# Patient Record
Sex: Male | Born: 1985 | Race: White | Hispanic: No | Smoking: Never smoker
Health system: Southern US, Community
[De-identification: ages and names within clinical notes are randomized; demographics above are authoritative.]

## PROBLEM LIST (undated history)

## (undated) HISTORY — PX: NO PAST SURGERIES: SHX2092

---

## 2015-03-12 ENCOUNTER — Other Ambulatory Visit: Payer: Self-pay | Admitting: Family Medicine

## 2015-03-12 DIAGNOSIS — N5082 Scrotal pain: Secondary | ICD-10-CM

## 2015-03-17 ENCOUNTER — Ambulatory Visit
Admission: RE | Admit: 2015-03-17 | Discharge: 2015-03-17 | Disposition: A | Discharge status: Home / Self Care | Payer: BLUE CROSS/BLUE SHIELD | Source: Ambulatory Visit | Attending: Family Medicine | Admitting: Family Medicine

## 2015-03-17 DIAGNOSIS — N5082 Scrotal pain: Secondary | ICD-10-CM | POA: Diagnosis present

## 2015-03-17 DIAGNOSIS — I861 Scrotal varices: Secondary | ICD-10-CM | POA: Diagnosis not present

## 2015-03-18 ENCOUNTER — Other Ambulatory Visit: Payer: Self-pay | Admitting: Family Medicine

## 2015-03-18 DIAGNOSIS — N5082 Scrotal pain: Secondary | ICD-10-CM

## 2015-03-19 ENCOUNTER — Encounter: Payer: Self-pay | Admitting: Family Medicine

## 2015-03-25 ENCOUNTER — Ambulatory Visit: Payer: BLUE CROSS/BLUE SHIELD | Attending: Family Medicine

## 2015-03-27 ENCOUNTER — Encounter: Payer: Self-pay | Admitting: Urology

## 2015-03-27 ENCOUNTER — Ambulatory Visit (INDEPENDENT_AMBULATORY_CARE_PROVIDER_SITE_OTHER): Payer: BLUE CROSS/BLUE SHIELD | Admitting: Urology

## 2015-03-27 VITALS — BP 115/74 | HR 60 | Ht 72.0 in | Wt 146.2 lb

## 2015-03-27 DIAGNOSIS — I861 Scrotal varices: Secondary | ICD-10-CM | POA: Diagnosis not present

## 2015-03-27 DIAGNOSIS — R102 Pelvic and perineal pain: Secondary | ICD-10-CM

## 2015-03-27 NOTE — Progress Notes (Signed)
03/27/2015 10:25 AM   Jordan Aguirre Dec 28, 1985 [id number]  Referring provider: Sula Rumple, MD [address] MEDICAL [address] [place], [place] [address]  Chief Complaint  Patient presents with  . New Patient (Initial Visit)    bilateral varicoceles    HPI: The patient presents with a 5 year history of left groin pain. He describes a sharp pain that is intermittent. At its worst, it is a 5 out of 10. It is intermittent. It is made worse by standing or long bouts of physical therapy. He also noted that it was worse when he [place].  He states sitting in certain positions make it better. He had a scrotal ultrasound that showed bilateral hydroceles with no other findings.  He denies any other urinary or urological problems at this time.   PMH: No past medical history on file.  Surgical History: Past Surgical History  Procedure Laterality Date  . No past surgeries      Home Medications:    Medication List    Notice  As of 03/27/2015 10:25 AM   You have not been prescribed any medications.      Allergies: No Known Allergies  Family History: Family History  Problem Relation Age of Onset  . Prostate cancer Neg Hx   . Bladder Cancer Neg Hx     Social History:  reports that he has never smoked. He does not have any smokeless tobacco history on file. He reports that he drinks alcohol. He reports that he does not use illicit drugs.  ROS: UROLOGY Frequent Urination?: No Hard to postpone urination?: No Burning/pain with urination?: No Get up at night to urinate?: No Leakage of urine?: No Urine stream starts and stops?: No Trouble starting stream?: No Do you have to strain to urinate?: No Blood in urine?: No Urinary tract infection?: No Sexually transmitted disease?: No Injury to kidneys or bladder?: No Painful intercourse?: No Weak stream?: No Erection problems?: No Penile pain?: No  Gastrointestinal Nausea?: No Vomiting?:  No Indigestion/heartburn?: No Diarrhea?: No Constipation?: No  Constitutional Fever: No Night sweats?: No Weight loss?: No Fatigue?: No  Skin Skin rash/lesions?: No Itching?: No  Eyes Blurred vision?: No Double vision?: No  Ears/Nose/Throat Sore throat?: No Sinus problems?: No  Hematologic/Lymphatic Swollen glands?: No Easy bruising?: No  Cardiovascular Leg swelling?: No Chest pain?: No  Respiratory Cough?: No Shortness of breath?: No  Endocrine Excessive thirst?: No  Musculoskeletal Back pain?: No Joint pain?: No  Neurological Headaches?: No Dizziness?: No  Psychologic Depression?: No Anxiety?: No  Physical Exam: BP 115/74 mmHg  Pulse 60  Ht 6' (1.829 m)  Wt 146 lb 3.2 oz (66.316 kg)  BMI 19.82 kg/m2  Constitutional:  Alert and oriented, No acute distress. HEENT: [place] AT, moist mucus membranes.  Trachea midline, no masses. Cardiovascular: No clubbing, cyanosis, or edema. Respiratory: Normal respiratory effort, no increased work of breathing. GI: Abdomen is soft, nontender, nondistended, no abdominal masses GU: No CVA tenderness. Normal phallus. Testicles are bilaterally. Unable to palpate the right varicocele seen on ultrasound. He does have a left varicocele. His pain is superior lateral to his spermatic cord. He does not have a hernia. Skin: No rashes, bruises or suspicious lesions. Lymph: No cervical or inguinal adenopathy. Neurologic: Grossly intact, no focal deficits, moving all 4 extremities. Psychiatric: Normal mood and affect.  Laboratory Data: No results found for: WBC, HGB, HCT, MCV, PLT  No results found for: CREATININE  No results found for:  PSA  No results found for: TESTOSTERONE  No results found for: HGBA1C  Urinalysis No results found for: COLORURINE, APPEARANCEUR, LABSPEC, PHURINE, GLUCOSEU, HGBUR, BILIRUBINUR, KETONESUR, PROTEINUR, UROBILINOGEN, NITRITE, LEUKOCYTESUR  Pertinent Imaging: CLINICAL DATA: Scrotal pain  LEFT greater than RIGHT intermittently for 5 years  EXAM: SCROTAL ULTRASOUND  DOPPLER ULTRASOUND OF THE TESTICLES  TECHNIQUE: Complete ultrasound examination of the testicles, epididymis, and other scrotal structures was performed. Color and spectral Doppler ultrasound were also utilized to evaluate blood flow to the testicles.  COMPARISON: None  FINDINGS: Right testicle  Measurements: 5.0 x 2.4 x 3.6 cm. Normal echogenicity without mass or calcification. Internal blood flow present on color Doppler imaging.  Left testicle  Measurements: 4.5 x 2.6 x 3.6 cm. Normal morphology without mass or calcification. Internal blood flow present on color Doppler imaging, symmetric with RIGHT.  Right epididymis: Normal in size and appearance.  Left epididymis: Normal in size and appearance.  Hydrocele: Minimal hydrocele fluid bilaterally.  Varicocele: BILATERAL varicoceles  Pulsed Doppler interrogation of both testes demonstrates normal low resistance arterial and venous waveforms bilaterally.  IMPRESSION: Normal appearing testes and epididymal head bilaterally.  No evidence of testicular torsion or mass.  BILATERAL varicoceles.  Assessment & Plan:    The patient has left groin pain. I feel his left varicocele is an incidental finding is his pain is not localized near the site. Due to his exacerbation of his symptoms with activity, I feel he has pelvic floor instability/pain. I discussed with the patient that the best treatment option for this is physical therapy. He is agreeable to proceeding with the pelvic floor physical therapy.  1. Left groin pain/pelvic floor pain -Referral to pelvic floor physical therapy  2. Left varicocele  -Asymptomatic no treatment required.   Return in about 3 months (around 06/25/2015).  Hildred Laser, MD  [facility] [address] [place], [place] [address] [phone]

## 2015-04-10 ENCOUNTER — Encounter: Payer: Self-pay | Admitting: Physical Therapy

## 2015-04-10 ENCOUNTER — Ambulatory Visit: Payer: BLUE CROSS/BLUE SHIELD | Attending: Urology | Admitting: Physical Therapy

## 2015-04-10 DIAGNOSIS — M25561 Pain in right knee: Secondary | ICD-10-CM | POA: Diagnosis present

## 2015-04-10 DIAGNOSIS — M25562 Pain in left knee: Secondary | ICD-10-CM | POA: Diagnosis present

## 2015-04-10 DIAGNOSIS — G8929 Other chronic pain: Secondary | ICD-10-CM | POA: Diagnosis present

## 2015-04-10 DIAGNOSIS — R279 Unspecified lack of coordination: Secondary | ICD-10-CM | POA: Insufficient documentation

## 2015-04-10 DIAGNOSIS — R1032 Left lower quadrant pain: Secondary | ICD-10-CM | POA: Diagnosis present

## 2015-04-10 DIAGNOSIS — R531 Weakness: Secondary | ICD-10-CM | POA: Diagnosis present

## 2015-04-10 NOTE — Therapy (Signed)
[place] [facility] MAIN [facility] SERVICES [address] [place], [place], [address] Phone: [phone]   Fax:  [phone]  Physical Therapy Evaluation  Patient Details  Name: Jordan Aguirre MRN: [id number] Date of Birth: 1985-08-18 Referring Provider: Sherryl Barters  Encounter Date: 04/10/2015      PT End of Session - 04/10/15 1025    Visit Number 1   Number of Visits 12   Date for PT Re-Evaluation 07/03/15   PT Start Time 0905   PT Stop Time 1010   PT Time Calculation (min) 65 min   Activity Tolerance Patient tolerated treatment well;No increased pain   Behavior During Therapy [facility] for tasks assessed/performed      History reviewed. No pertinent past medical history.  Past Surgical History  Procedure Laterality Date  . No past surgeries      There were no vitals filed for this visit.  Visit Diagnosis:  Lack of coordination   Weakness -  Knee pain, bilateral  Chronic groin pain, left      Subjective Assessment - 04/10/15 0904    Subjective 1) L groin pain: Pt reports he felt L localized groin pain (dull ache to sharp) that occurred once a month for the past 5 years after walking 10 miles or heavy lifting. The pain has become progressive worse with increase in frequency since this past spring to daily occurrence of pain. Pain remains dull 2/10 typically but as he carries objects (backpack, plywood) , the pain will increase to 5/10 after 30 min and lasts for 30 min-1 hr.  Pain eases with laying down. Pt has not received any medical attention to this issue. denied urinary/ bowel/ and sexual dysfunction. Hx of playing soccer and tennis in high school with repeated sit up and crunches. Pt has withheld weight lifting due pain but would like to get back it. Pt would like to be able to walk 10 miles without pain. Pt walks 1 mile daily without pain but if he walked 2-3 miles, the pain will increase.  2) B knee pain: with stair climbing. L knee injury after playing  basketball 10 years which had led to not playing for 4-6 weeks.      Pertinent History Hx of  ankle sprain (required crutches) . Pt was not sure of which LE. Did not seek PT.    Patient Stated Goals exercises and reduce the pain to increase the physical activity.    Currently in Pain? 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 w/ rising from flexion. B rotation 50%    Strength   Overall Strength Comments hip flex seated 4-/5    Palpation   SI assessment  symmetrical SIJ   Palpation comment not able to reproduce pain w/ palpation over L suprapubic, anterior/ posterior perineal triangles in hooklying   Bed Mobility   Bed Mobility --  log rolling   Ambulation/Gait   Gait Comments slouched, decreased stride length, decreased hip flexion b                 Pelvic Floor Special Questions - 04/10/15 1045    Diastasis Recti 2.5 fingers width below sternum          [facility] Adult PT Treatment/Exercise - 04/10/15 1018    Self-Care   Self-Care --  POC, anatomy/  physiology, HEP , goals   Therapeutic Activites    Therapeutic Activities --  body mechanics w. (lifting, digging, shoveling)    Neuro Re-ed    Neuro Re-ed Details  decrease downward forces on pelvic floor and abdomen (see pt instructions) , alignment and posture                 PT Education - 04/10/15 1025    Education provided Yes   Education Details POC, anatomy, physiology, goals, principles of deep core in functional activities, body mechanics training   Person(s) Educated Patient   Methods Explanation;Demonstration;Tactile cues;Verbal cues;Handout   Comprehension Verbalized understanding             PT Long Term Goals - 04/10/15 4098    PT LONG TERM GOAL #1   Title Pt will be able to demo proper lifting mechanics with deep core activation in order to minimize worsening of symptoms.    Time 12   Period Weeks   Status New   PT LONG TERM GOAL #2   Title Pt will demo proper sitting posture across 2 visits in order to show improved alignment of spine for optimial deep core mm activation.   Time 12   Period Weeks   Status New   PT LONG TERM GOAL #3   Title Pt will decrease his score on NIH-CPSI: 30% to < 15% in order to show improve QOL and return to activities.    Time 12   Period Weeks   Status New   PT LONG TERM GOAL #4   Title Pt will decrease his score on PDI from 20% to < 10% in order to improve QOL.    Time 12   Period Weeks   Status New   PT LONG TERM GOAL #5   Title Pt will demo no lumbopelvic insstability across 5 reps of deep core level 1-4 exercises to progress to upright strengthening exercises and improve sever thoracic kyphosis for optimal deep core activition.   Time 12   Period Weeks   Status New   Additional Long Term Goals   Additional Long Term Goals Yes   PT LONG TERM GOAL #6   Title Pt will report no knee pain with stairclimbing in order to return ADLs.   Time 12   Period Weeks   Status New               Plan - 04/10/15  1028    Clinical Impression Statement Pt is a 30 yo male who complaints of  chronic L groin pain and B knee pain (L >  R) which impacts his ability to  perform heavy lifting and walking long distances (10 miles). Pt's  clinical presentations show generalized weakness, abdominal separation  of rectus abdominis (diastasis recti 2.5 fingers width below sternum),  poor  activation of deep core mm system, poor body mechanics with exertional  activities, and poor upright posture and knee alignment in sitting,  standing, walking, and functional activities.  Pain was not reproduced w/ palpation over pelvic floor mm, R inguinal area, nor thoracic spine (nerve roots to inguinal/ groin area). Pain was reduced with training on body mechanics and co-activation of deep core mm  during against gravity positions which indicates pt will require more motor control and strengthening to address his Sx. Pt 's personal  factors that contribute ot his  deficits include Hx of knee injury, excessive performance of  sit-ups/crunches in the past, chronicity of pain and decreased physical  activities, and repeated lifting and exertional activities at his current  job. With these factors combined, pt's condition is moderate in complexity  and evolving.             Pt will benefit from skilled therapeutic intervention in order to improve on the following deficits Abnormal gait;Improper body mechanics;Postural dysfunction;Decreased activity tolerance;Decreased mobility;Impaired flexibility;Decreased range of motion;Decreased cognition;Hypermobility;Decreased coordination;Decreased endurance;Increased fascial restricitons;Hypomobility;Pain;Decreased safety awareness   Rehab Potential Good   PT Frequency 1x / week   PT Duration 12 weeks   PT Treatment/Interventions ADLs/Self Care Home Management;Aquatic Therapy;Biofeedback;Cryotherapy;Electrical Stimulation;Iontophoresis 4mg /ml Dexamethasone;Traction;Moist Heat;Patient/family  education;Balance training;Therapeutic activities;Therapeutic exercise;Functional mobility training;Stair training;Gait training;Manual techniques;Passive range of motion;Energy conservation;Taping   Consulted and Agree with Plan of Care Patient         Problem List There are no active problems to display for this patient.   Mariane Masters ,PT, DPT, E-RYT  04/10/2015, 10:55 AM  [place] [facility] MAIN [facility] SERVICES [address] [place], [place], [address] Phone: [phone]   Fax:  [phone]  Name: Jordan Aguirre MRN: [id number] Date of Birth: 1986/01/25

## 2015-04-10 NOTE — Patient Instructions (Signed)
                                               Preserve the function of your pelvic floor, abdomen, and back.              Avoid decreased straining of abdominal/pelvic floor muscles with less              slouching,  holding your breath with lifting/bowel movements)                                                     FUNCTIONAL POSTURES        

## 2015-05-05 ENCOUNTER — Ambulatory Visit: Payer: BLUE CROSS/BLUE SHIELD | Attending: Urology | Admitting: Physical Therapy

## 2015-05-05 DIAGNOSIS — M25562 Pain in left knee: Secondary | ICD-10-CM | POA: Insufficient documentation

## 2015-05-05 DIAGNOSIS — R279 Unspecified lack of coordination: Secondary | ICD-10-CM | POA: Insufficient documentation

## 2015-05-05 DIAGNOSIS — R1032 Left lower quadrant pain: Secondary | ICD-10-CM | POA: Insufficient documentation

## 2015-05-05 DIAGNOSIS — G8929 Other chronic pain: Secondary | ICD-10-CM | POA: Insufficient documentation

## 2015-05-05 DIAGNOSIS — M25561 Pain in right knee: Secondary | ICD-10-CM | POA: Insufficient documentation

## 2015-05-05 DIAGNOSIS — R531 Weakness: Secondary | ICD-10-CM | POA: Insufficient documentation

## 2015-05-12 ENCOUNTER — Ambulatory Visit: Payer: BLUE CROSS/BLUE SHIELD | Admitting: Physical Therapy

## 2015-05-12 DIAGNOSIS — G8929 Other chronic pain: Secondary | ICD-10-CM | POA: Diagnosis present

## 2015-05-12 DIAGNOSIS — R531 Weakness: Secondary | ICD-10-CM

## 2015-05-12 DIAGNOSIS — R279 Unspecified lack of coordination: Secondary | ICD-10-CM | POA: Diagnosis present

## 2015-05-12 DIAGNOSIS — M25562 Pain in left knee: Secondary | ICD-10-CM | POA: Diagnosis present

## 2015-05-12 DIAGNOSIS — M25561 Pain in right knee: Secondary | ICD-10-CM | POA: Diagnosis present

## 2015-05-12 DIAGNOSIS — R1032 Left lower quadrant pain: Secondary | ICD-10-CM

## 2015-05-12 NOTE — Patient Instructions (Addendum)
You are now ready to begin training the deep core muscles system: diaphragm, transverse abdominis, pelvic floor . These muscles must work together as a team.           The key to these exercises to train the brain to coordinate the timing of these muscles and to have them turn on for long periods of time to hold you upright against gravity (especially important if you are on your feet all day).These muscles are postural muscles and play a role stabilizing your spine and bodyweight. By doing these repetitions slowly and correctly instead of doing crunches, you will achieve a flatter belly without a lower pooch. You are also placing your spine in a more neutral position and breathing properly which in turn, decreases your risk for problems related to your pelvic floor, abdominal, and low back such as pelvic organ prolapse, hernias, diastasis recti (separation of superficial muscles), disk herniations, spinal fractures. These exercises set a solid foundation for you to later progress to resistance/ strength training with therabands and weights and return to other typical fitness exercises with a stronger deeper core.      deep core 1-2 x 20reps  (see above)  10 x 3 sets          "w" w/ red band x  10 x 3 sets         bridging level 1 -2 w/ yellow band 10 x 3 sets         Floor to rise:  Half kneeling  To high lunge  :  Feet hip width, front knee above ankle (do not let it move it)   5 x

## 2015-05-12 NOTE — Therapy (Addendum)
[place] MAIN [facility] SERVICES [address] [place], [place], [address] Phone: [phone]   Fax:  [phone]  Physical Therapy Treatment  Patient Details  Name: Jordan Aguirre MRN: [id number] Date of Birth: [birth year] Referring Provider: Pilar Aguirre  Encounter Date: 05/12/2015      PT End of Session - 05/12/15 0836    Visit Number 2   Number of Visits 12   Date for PT Re-Evaluation 07/03/15   PT Start Time 0805   PT Stop Time 0850   PT Time Calculation (min) 45 min   Activity Tolerance Patient tolerated treatment well;No increased pain   Behavior During Therapy [facility] Aguirre for tasks assessed/performed      No past medical history on file.  Past Surgical History  Procedure Laterality Date  . No past surgeries      There were no vitals filed for this visit.  Visit Diagnosis:  Lack of coordination  Knee pain, bilateral  Chronic groin pain, left  Weakness      Subjective Assessment - 05/12/15 0810    Subjective Pt reported 20% improvement with L groin pain with proper sitting and exhaling technqiue withe xertional activities. Pt will start a new job and will be moving out of state in two weeks. Pt will only be able to attend PT for 2 more visits.    Pertinent History Hx of  ankle sprain (required crutches) . Pt was not sure of which LE. Did not seek PT.    Patient Stated Goals exercises and reduce the pain to increase the physical activity.             [facility] PT Assessment - 05/12/15 0811    Observation/Other Assessments   Observations required cuing for sitting, feet under knees                      [facility] Adult PT Treatment/Exercise - 05/12/15 0817    Neuro Re-ed    Neuro Re-ed Details  deep core 1-2 x 20reps, "w" x 10 reps.bridging level 1 -2 w/ yellow band (10 rep), floor to rise 10 reps  each leg                   PT Education - 05/12/15 0835    Education provided Yes   Education Details HEP   Person(s)  Educated Patient   Methods Explanation;Demonstration;Tactile cues;Verbal cues;Handout   Comprehension Returned demonstration;Verbalized understanding             PT Long Term Goals - 05/12/15 0837    PT LONG TERM GOAL #1   Title Pt will be able to demo proper lifting mechanics with deep core activation in order to minimize worsening of symptoms.    Time 12   Period Weeks   Status Achieved   PT LONG TERM GOAL #2   Title Pt will demo proper sitting posture across 2 visits in order to show improved alignment of spine for optimial deep core mm activation.   Time 12   Period Weeks   Status Partially Met   PT LONG TERM GOAL #3   Title Pt will decrease his score on NIH-CPSI: 30% to < 15% in order to show improve QOL and return to activities.    Time 12   Period Weeks   Status On-going   PT LONG TERM GOAL #4   Title Pt will decrease his score on [place] from 20% to < 10% in order to improve  QOL.    Time 12   Period Weeks   Status New   PT LONG TERM GOAL #5   Title Pt will demo no lumbopelvic instability across 5 reps of deep core level 1-4 exercises to progress to upright strengthening exercises and improve sever thoracic kyphosis for optimal deep core activition.   Time 12   Period Weeks   Status Partially Met   PT LONG TERM GOAL #6   Title Pt will report no knee pain with stairclimbing in order to return ADLs.   Time 12   Period Weeks   Status On-going               Plan - 05/12/15 0836    Clinical Impression Statement Pt is progressing well and was able to tolerate deep core and external core strengthening exercises with moderate cuing for scap depression/retraction and eccentric control.  Pt will continue to benefit from skilled PT.    Pt will benefit from skilled therapeutic intervention in order to improve on the following deficits Abnormal gait;Improper body mechanics;Postural dysfunction;Decreased activity tolerance;Decreased mobility;Impaired flexibility;Decreased  range of motion;Decreased cognition;Hypermobility;Decreased coordination;Decreased endurance;Increased fascial restricitons;Hypomobility;Pain;Decreased safety awareness   Rehab Potential Good   PT Frequency 1x / week   PT Duration 12 weeks   PT Treatment/Interventions ADLs/Self Care Home Management;Aquatic Therapy;Biofeedback;Cryotherapy;Electrical Stimulation;Iontophoresis 67m/ml Dexamethasone;Traction;Moist Heat;Patient/family education;Balance training;Therapeutic activities;Therapeutic exercise;Functional mobility training;Stair training;Gait training;Manual techniques;Passive range of motion;Energy conservation;Taping   Consulted and Agree with Plan of Care Patient        Problem List There are no active problems to display for this patient.   Jordan Aguirre,PT, DPT, E-RYT  05/12/2015, 11:00 AM  [place] [facility] [address]RWinters [place] [address]Phone: [phone]  Fax:  [phone] Name: Jordan SanMRN: [id number] of Birth: 410-01-1986

## 2015-05-19 ENCOUNTER — Ambulatory Visit: Payer: BLUE CROSS/BLUE SHIELD | Admitting: Physical Therapy

## 2015-05-26 ENCOUNTER — Encounter: Payer: BLUE CROSS/BLUE SHIELD | Admitting: Physical Therapy

## 2015-06-02 ENCOUNTER — Encounter: Payer: BLUE CROSS/BLUE SHIELD | Admitting: Physical Therapy

## 2015-06-04 ENCOUNTER — Ambulatory Visit: Payer: BLUE CROSS/BLUE SHIELD | Attending: Urology | Admitting: Physical Therapy

## 2015-06-04 DIAGNOSIS — M25561 Pain in right knee: Secondary | ICD-10-CM | POA: Insufficient documentation

## 2015-06-04 DIAGNOSIS — R1032 Left lower quadrant pain: Secondary | ICD-10-CM | POA: Insufficient documentation

## 2015-06-04 DIAGNOSIS — G8929 Other chronic pain: Secondary | ICD-10-CM | POA: Diagnosis present

## 2015-06-04 DIAGNOSIS — M25562 Pain in left knee: Secondary | ICD-10-CM | POA: Insufficient documentation

## 2015-06-04 DIAGNOSIS — R531 Weakness: Secondary | ICD-10-CM | POA: Insufficient documentation

## 2015-06-04 DIAGNOSIS — R279 Unspecified lack of coordination: Secondary | ICD-10-CM

## 2015-06-04 NOTE — Patient Instructions (Addendum)
Level 3, 4 (handout of deep core exercises)  Level 5 opposite arm and leg    ___________________________ 20 ft x both sides x 2 laps:   1) Sidestepping  + squat (knees behind toes) With red band at ankles    2) Sidestepping  _+ squat + tricep curls behind head    3) Forward lunge with bicep curls , reset with feet parallel between steps   __________________________  1) Blue band at top of door (PNF patterns)  10 x 3 laps  __Seatbelt   2) Blue band at doorknob 10 x 2 with right foot forward, 10 x 2 with left foot forward   3) Blue band under feet:  ____Drawing the sword   ____standning on one foot, bicep curls  __________________________  Wall squats with shoulder back  10 x 2 (knee angle 60 degree )   Then hold 10 pound weight  10 x 2   __________________________  Stretches:   Figure 4 Knees to chest 3 way stretch at table top

## 2015-06-04 NOTE — Therapy (Signed)
[place] MAIN [facility] SERVICES [address] [place], [place], [address] Phone: [phone]   Fax:  [phone]  Physical Therapy Treatment /Discharge Summary  Patient Details  Name: Jordan Aguirre MRN: [id number] Date of Birth: 10-30-1985 Referring Provider: Pilar Jarvis  Encounter Date: 06/04/2015      PT End of Session - 06/04/15 1140    Visit Number 3   Number of Visits 12   Date for PT Re-Evaluation 07/03/15   PT Start Time 0945   PT Stop Time 5320   PT Time Calculation (min) 60 min   Activity Tolerance Patient tolerated treatment well;No increased pain   Behavior During Therapy [facility] for tasks assessed/performed      No past medical history on file.  Past Surgical History  Procedure Laterality Date  . No past surgeries      There were no vitals filed for this visit.  Visit Diagnosis:  Lack of coordination  Knee pain, bilateral  Chronic groin pain, left  Weakness      Subjective Assessment - 06/04/15 1104    Subjective Pt reported he has not had L groin pain for the past 3 weeks except when he lifted 50 pounds items. The pain dissipated after a day. Pt will not be able to come to any more PT sessions due to [place]. Pt has been performing his exercises.    Pertinent History Hx of  ankle sprain (required crutches) . Pt was not sure of which LE. Did not seek PT.    Patient Stated Goals exercises and reduce the pain to increase the physical activity.             [facility] PT Assessment - 06/04/15 1108    Observation/Other Assessments   Observations slouched posture, required excessive cues   Coordination   Gross Motor Movements are Fluid and Coordinated --  good coordination with deep core exercises                     [facility] Adult PT Treatment/Exercise - 06/04/15 1135    Therapeutic Activites    Therapeutic Activities --  simulated hiking w/ weighted bookbag, lifting w/ 20# box    Neuro Re-ed    Neuro Re-ed Details  upright posture   scapular retraction   Exercises   Exercises --  see pt instructions                PT Education - 06/04/15 1140    Education provided Yes   Education Details HEP, d/c   Person(s) Educated Patient   Methods Explanation;Demonstration;Tactile cues;Verbal cues;Handout   Comprehension Returned demonstration;Verbalized understanding             PT Long Term Goals - 06/04/15 0953    PT LONG TERM GOAL #1   Title Pt will be able to demo proper lifting mechanics with deep core activation in order to minimize worsening of symptoms.    Time 12   Period Weeks   Status Achieved   PT LONG TERM GOAL #2   Title Pt will demo proper sitting posture across 2 visits in order to show improved alignment of spine for optimial deep core mm activation.   Time 12   Period Weeks   Status Achieved   PT LONG TERM GOAL #3   Title Pt will decrease his score on NIH-CPSI: 30% to < 15% in order to show improve QOL and return to activities. (4/5: 9%)  Time 12   Period Weeks   Status Not Met   PT LONG TERM GOAL #4   Title Pt will decrease his score on Register from 20% to < 10% in order to improve QOL. (9% 4/5/)    Time 12   Period Weeks   Status Achieved   PT LONG TERM GOAL #5   Title Pt will demo no lumbopelvic instability across 5 reps of deep core level 1-4 exercises to progress to upright strengthening exercises and improve sever thoracic kyphosis for optimal deep core activition.   Time 12   Period Weeks   Status Achieved   Additional Long Term Goals   Additional Long Term Goals Yes   PT LONG TERM GOAL #6   Title Pt will report no knee pain with stairclimbing in order to return ADLs.   Time 12   Period Weeks   Status Achieved   PT LONG TERM GOAL #7   Title Pt wil be able to have no pain with bouncy car rides.    Time 12   Period Weeks   Status Achieved               Plan - 06/04/15 1028    Clinical Impression Statement Across the  past 3 visits, pt reports his groin pain has improved "Quite a bit better " since [facility] according to the [facility] scale. Pt reports with heavyl lifting, his groin pain has improved  "A little bit better". Pt has achieved 6/7 goals and was progressing well towards his remaining goal but pt has to end therapy due to moving out of town for a job. Pt is ready to be d/c at this time as he has been educated on progression of strengthening exercises and body mechanics.  Thank you for your referral!    Pt will benefit from skilled therapeutic intervention in order to improve on the following deficits Abnormal gait;Improper body mechanics;Postural dysfunction;Decreased activity tolerance;Decreased mobility;Impaired flexibility;Decreased range of motion;Decreased cognition;Hypermobility;Decreased coordination;Decreased endurance;Increased fascial restricitons;Hypomobility;Pain;Decreased safety awareness   Rehab Potential Good   PT Frequency 1x / week   PT Duration 12 weeks   PT Treatment/Interventions ADLs/Self Care Home Management;Aquatic Therapy;Biofeedback;Cryotherapy;Electrical Stimulation;Iontophoresis 42m/ml Dexamethasone;Traction;Moist Heat;Patient/family education;Balance training;Therapeutic activities;Therapeutic exercise;Functional mobility training;Stair training;Gait training;Manual techniques;Passive range of motion;Energy conservation;Taping   Consulted and Agree with Plan of Care Patient        Problem List There are no active problems to display for this patient.   YJerl Mina,PT, DPT, E-RYT  06/04/2015, 3:48 PM  [place]SERV[facility] [address] [place] [address]Phone: [phone]  Fax:  [phone] Name: Jordan ManueleMRN: [id number] of Birth: [birth year]

## 2015-06-09 ENCOUNTER — Encounter: Payer: BLUE CROSS/BLUE SHIELD | Admitting: Physical Therapy

## 2015-06-16 ENCOUNTER — Encounter: Payer: BLUE CROSS/BLUE SHIELD | Admitting: Physical Therapy

## 2015-06-23 ENCOUNTER — Encounter: Payer: BLUE CROSS/BLUE SHIELD | Admitting: Physical Therapy

## 2015-06-25 ENCOUNTER — Ambulatory Visit: Payer: BLUE CROSS/BLUE SHIELD

## 2017-11-10 IMAGING — US US SCROTUM
1 series · 14 of 25 positions shown · non-contrast
Comparison: None

CLINICAL DATA: Scrotal pain LEFT greater than RIGHT intermittently
for 5 years

EXAM:
SCROTAL ULTRASOUND
DOPPLER ULTRASOUND OF THE TESTICLES
TECHNIQUE: Complete ultrasound examination of the testicles, epididymis, and
other scrotal structures was performed. Color and spectral Doppler
ultrasound were also utilized to evaluate blood flow to the
testicles.

[Series 1: us scrotum · 0.07mm/px · 14 of 71 slices shown]
[im 1/71]
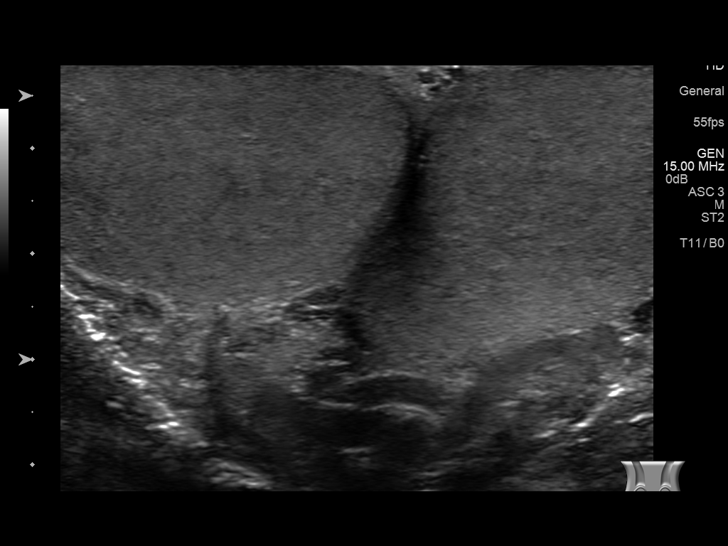
[im 6/71]
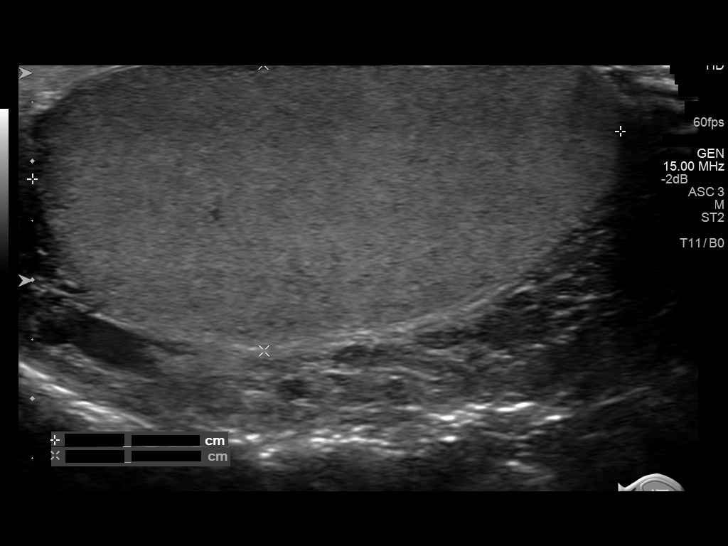
[im 12/71]
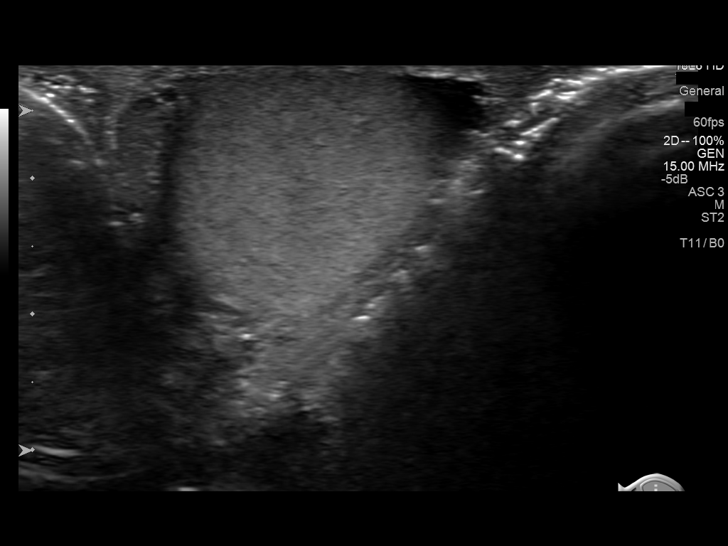
[im 18/71]
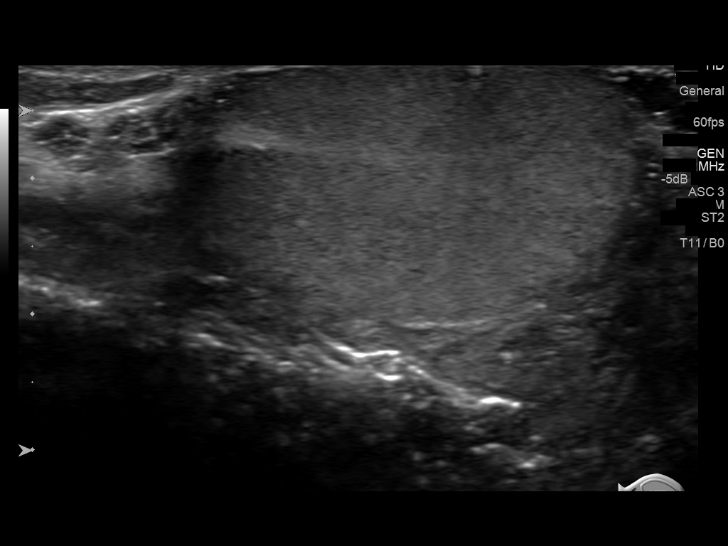
[im 24/71]
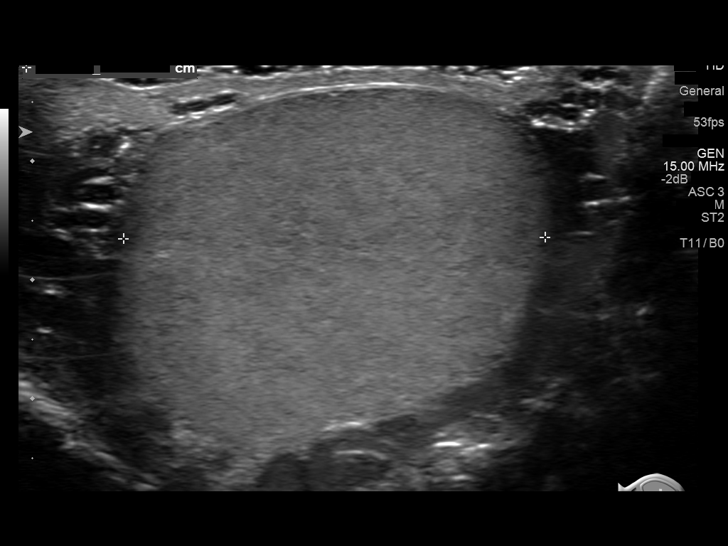
[im 27/71]
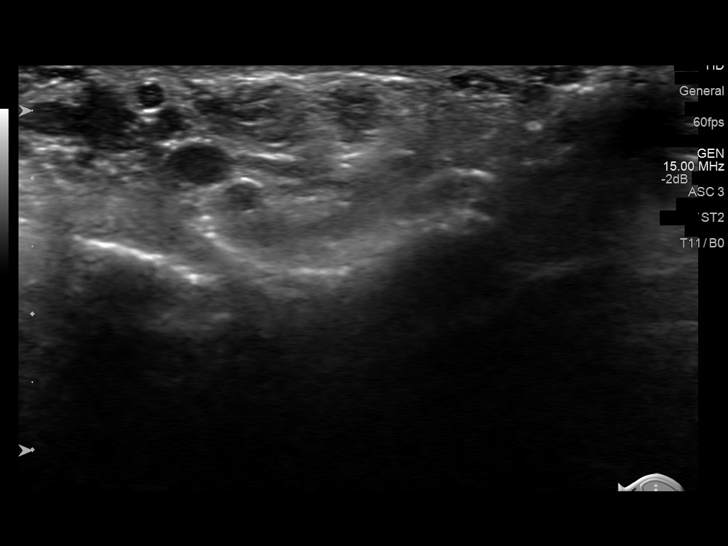
[im 33/71]
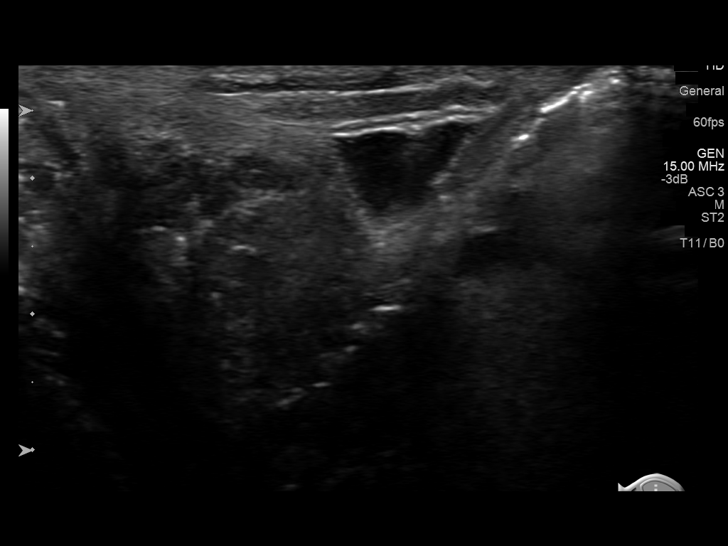
[im 38/71]
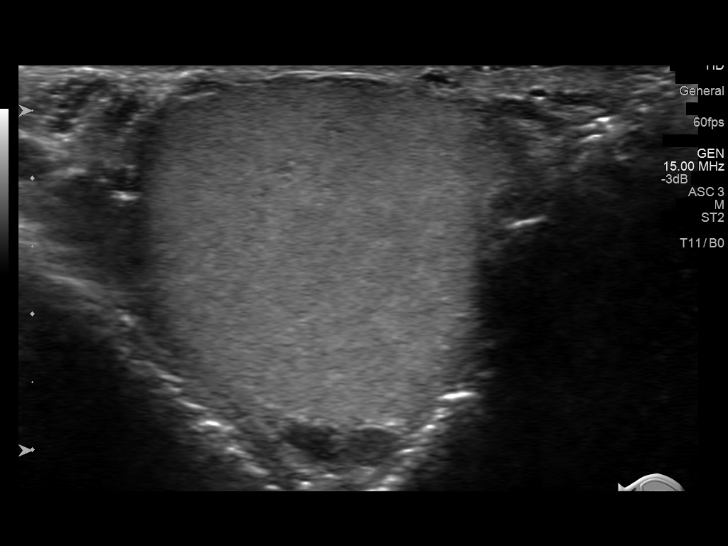
[im 44/71]
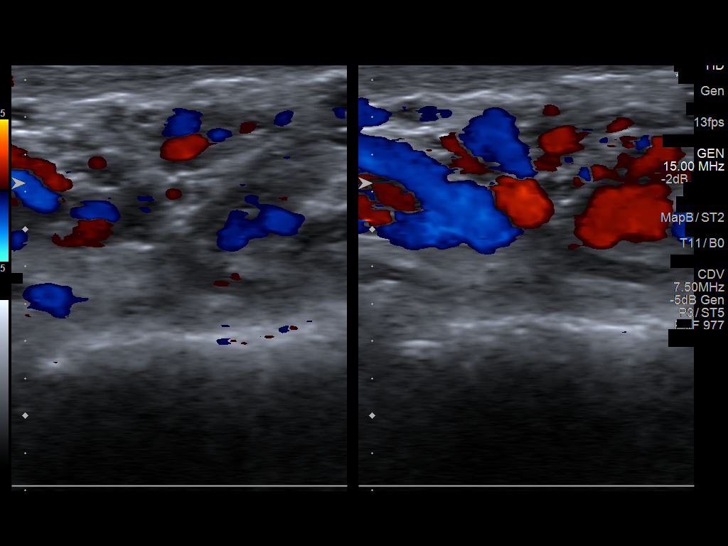
[im 47/71]
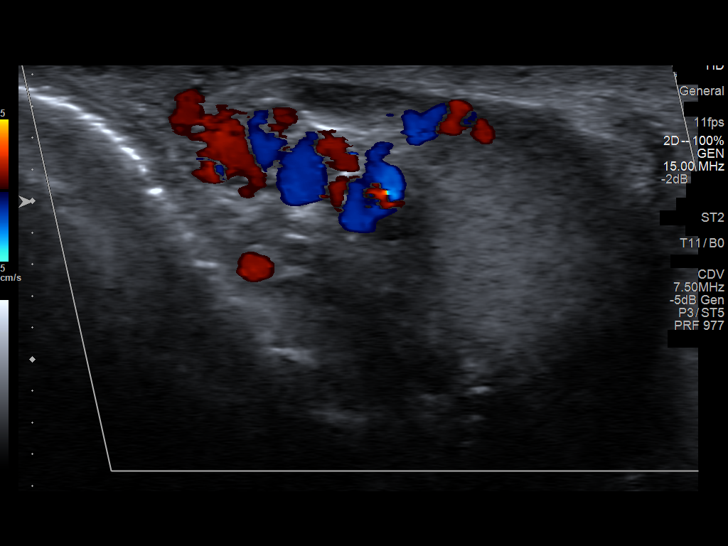
[im 53/71]
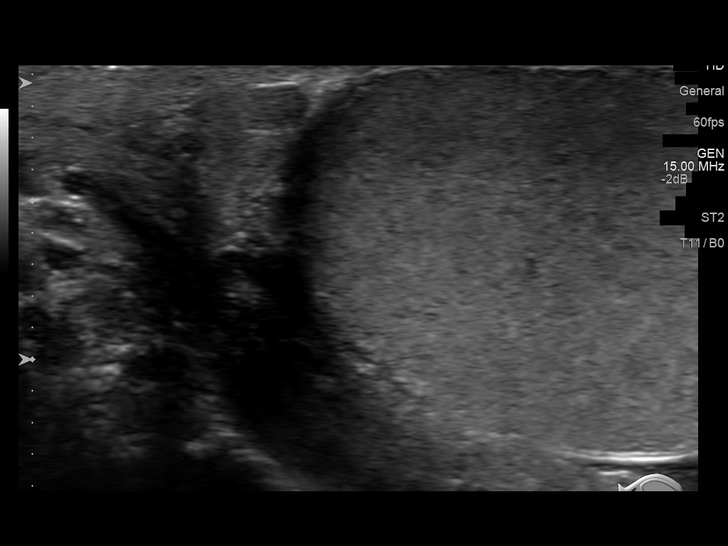
[im 59/71]
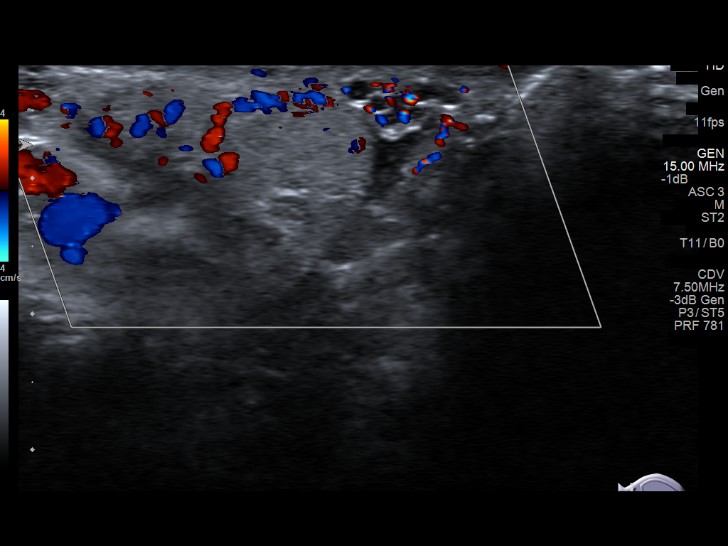
[im 65/71]
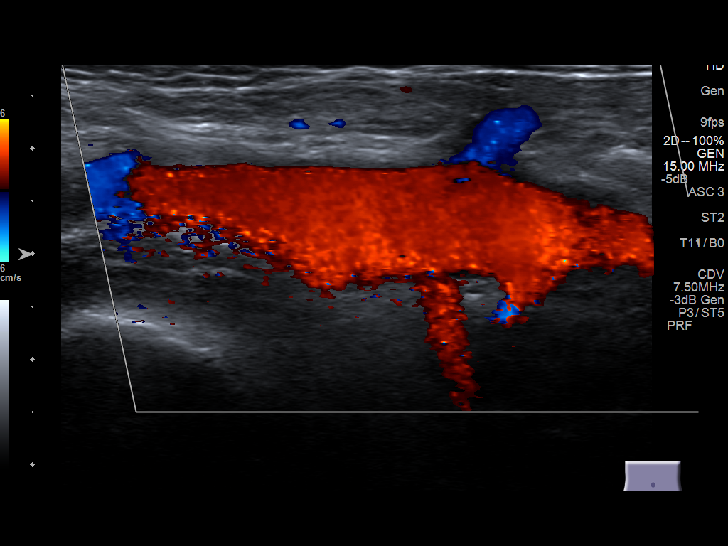
[im 71/71]
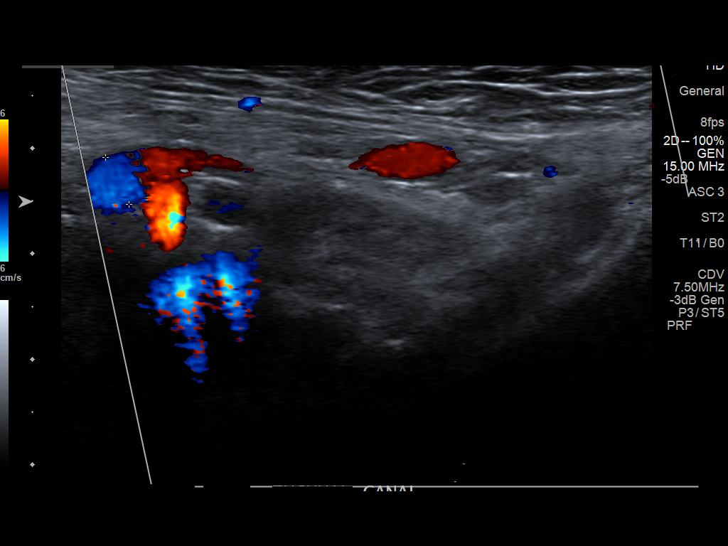

[14 of 25 positions shown; findings below may reference images not displayed]

FINDINGS: Right testicle

Measurements: 5.0 x 2.4 x 3.6 cm. Normal echogenicity without mass
or calcification. Internal blood flow present on color Doppler
imaging.

Left testicle

Measurements: 4.5 x 2.6 x 3.6 cm. Normal morphology without mass or
calcification. Internal blood flow present on color Doppler imaging,
symmetric with RIGHT.

Right epididymis:  Normal in size and appearance.

Left epididymis:  Normal in size and appearance.

Hydrocele:  Minimal hydrocele fluid bilaterally.

Varicocele:  BILATERAL varicoceles

Pulsed Doppler interrogation of both testes demonstrates normal low
resistance arterial and venous waveforms bilaterally.
IMPRESSION: Normal appearing testes and epididymal head bilaterally.

No evidence of testicular torsion or mass.

BILATERAL varicoceles.
